# Patient Record
Sex: Female | Born: 1960 | Race: White | Hispanic: No | Marital: Single | Smoking: Never smoker
Health system: Southern US, Community
[De-identification: ages and names within clinical notes are randomized; demographics above are authoritative.]

## PROBLEM LIST (undated history)

## (undated) DIAGNOSIS — I10 Essential (primary) hypertension: Secondary | ICD-10-CM

## (undated) DIAGNOSIS — E785 Hyperlipidemia, unspecified: Secondary | ICD-10-CM

## (undated) DIAGNOSIS — E119 Type 2 diabetes mellitus without complications: Secondary | ICD-10-CM

## (undated) HISTORY — PX: ABDOMINAL HYSTERECTOMY: SHX81

## (undated) HISTORY — DX: Essential (primary) hypertension: I10

## (undated) HISTORY — DX: Hyperlipidemia, unspecified: E78.5

## (undated) HISTORY — DX: Type 2 diabetes mellitus without complications: E11.9

---

## 2004-10-30 ENCOUNTER — Ambulatory Visit: Payer: Self-pay | Admitting: Family Medicine

## 2005-03-03 ENCOUNTER — Ambulatory Visit: Payer: Self-pay | Admitting: Family Medicine

## 2006-03-11 ENCOUNTER — Ambulatory Visit: Payer: Self-pay | Admitting: Family Medicine

## 2007-03-03 ENCOUNTER — Ambulatory Visit: Payer: Self-pay | Admitting: Family Medicine

## 2008-03-05 ENCOUNTER — Ambulatory Visit: Payer: Self-pay | Admitting: Family Medicine

## 2009-05-21 ENCOUNTER — Ambulatory Visit: Payer: Self-pay | Admitting: Family Medicine

## 2010-07-29 ENCOUNTER — Ambulatory Visit: Payer: Self-pay | Admitting: Unknown Physician Specialty

## 2010-10-27 ENCOUNTER — Ambulatory Visit: Payer: Self-pay | Admitting: Family Medicine

## 2011-11-03 ENCOUNTER — Ambulatory Visit: Payer: Self-pay | Admitting: Family Medicine

## 2012-12-15 ENCOUNTER — Ambulatory Visit: Payer: Self-pay | Admitting: Family Medicine

## 2013-11-13 ENCOUNTER — Ambulatory Visit: Payer: Self-pay | Admitting: Family Medicine

## 2013-12-02 ENCOUNTER — Ambulatory Visit: Payer: Self-pay | Admitting: Family Medicine

## 2014-01-30 ENCOUNTER — Ambulatory Visit: Payer: Self-pay | Admitting: Family Medicine

## 2014-04-02 ENCOUNTER — Ambulatory Visit: Payer: Self-pay | Admitting: Family Medicine

## 2014-04-03 ENCOUNTER — Ambulatory Visit: Payer: Self-pay | Admitting: Family Medicine

## 2014-05-04 ENCOUNTER — Ambulatory Visit: Payer: Self-pay | Admitting: Family Medicine

## 2014-07-10 ENCOUNTER — Ambulatory Visit: Payer: Self-pay | Admitting: Surgery

## 2015-01-23 ENCOUNTER — Other Ambulatory Visit: Payer: Self-pay | Admitting: Family Medicine

## 2015-01-23 DIAGNOSIS — Z1231 Encounter for screening mammogram for malignant neoplasm of breast: Secondary | ICD-10-CM

## 2015-02-01 ENCOUNTER — Ambulatory Visit
Admission: RE | Admit: 2015-02-01 | Discharge: 2015-02-01 | Disposition: A | Discharge status: Home / Self Care | Payer: PRIVATE HEALTH INSURANCE | Source: Ambulatory Visit | Attending: Family Medicine | Admitting: Family Medicine

## 2015-02-01 DIAGNOSIS — Z1231 Encounter for screening mammogram for malignant neoplasm of breast: Secondary | ICD-10-CM | POA: Diagnosis present

## 2016-06-04 ENCOUNTER — Other Ambulatory Visit: Payer: Self-pay | Admitting: Family Medicine

## 2016-06-04 DIAGNOSIS — Z1231 Encounter for screening mammogram for malignant neoplasm of breast: Secondary | ICD-10-CM

## 2016-06-11 ENCOUNTER — Ambulatory Visit
Admission: RE | Admit: 2016-06-11 | Discharge: 2016-06-11 | Disposition: A | Discharge status: Home / Self Care | Payer: PRIVATE HEALTH INSURANCE | Source: Ambulatory Visit | Attending: Family Medicine | Admitting: Family Medicine

## 2016-06-11 DIAGNOSIS — Z1231 Encounter for screening mammogram for malignant neoplasm of breast: Secondary | ICD-10-CM | POA: Insufficient documentation

## 2016-10-12 ENCOUNTER — Ambulatory Visit (INDEPENDENT_AMBULATORY_CARE_PROVIDER_SITE_OTHER): Payer: Managed Care, Other (non HMO) | Admitting: Vascular Surgery

## 2016-10-12 ENCOUNTER — Encounter (INDEPENDENT_AMBULATORY_CARE_PROVIDER_SITE_OTHER): Payer: Self-pay | Admitting: Vascular Surgery

## 2016-10-12 VITALS — BP 106/70 | HR 57 | Resp 16 | Ht 62.0 in | Wt 153.0 lb

## 2016-10-12 DIAGNOSIS — E118 Type 2 diabetes mellitus with unspecified complications: Secondary | ICD-10-CM | POA: Diagnosis not present

## 2016-10-12 DIAGNOSIS — E785 Hyperlipidemia, unspecified: Secondary | ICD-10-CM | POA: Diagnosis not present

## 2016-10-12 DIAGNOSIS — I83813 Varicose veins of bilateral lower extremities with pain: Secondary | ICD-10-CM | POA: Diagnosis not present

## 2016-10-12 DIAGNOSIS — E119 Type 2 diabetes mellitus without complications: Secondary | ICD-10-CM | POA: Insufficient documentation

## 2016-10-12 NOTE — Progress Notes (Signed)
Subjective:    Patient ID: Lauren Dominguez, female    DOB: 11-30-1960, 56 y.o.   MRN: [id number] Chief Complaint  Patient presents with  . New Evaluation    Varicose Veins    Presents as a new patient referred by Dr. Su Grand for "bilateral lower extremity painful varicose veins". Patient complains of progressively worsening pain along her varicose veins. This is progressively been worsening over the last 3-4 years. Patient states her right lower extremity symptoms are worse than her left. She has noticed over the last few years an increase in size in her right lower extremity varicose veins. The patient experiences pain along her lower extremity varicose veins which tends to be worse at night. Patient is active and does stand for long periods of time. She also experiences cramping in her legs at night. She denies any surgery or trauma to her lower extremity. She does not wear compression or engage elevation at this time. She denies any fever nausea vomiting.    Review of Systems  Constitutional: Negative.   HENT: Negative.   Eyes: Negative.   Respiratory: Negative.   Cardiovascular:       Lower extremity painful varicose veins  Gastrointestinal: Negative.   Endocrine: Negative.   Genitourinary: Negative.   Musculoskeletal: Negative.   Skin: Negative.   Allergic/Immunologic: Negative.   Neurological: Negative.   Hematological: Negative.   Psychiatric/Behavioral: Negative.       Objective:   Physical Exam  Constitutional: She is oriented to person, place, and time. She appears well-developed and well-nourished. No distress.  HENT:  Head: Normocephalic and atraumatic.  Eyes: Conjunctivae are normal. Pupils are equal, round, and reactive to light.  Neck: Normal range of motion.  Cardiovascular: Normal rate, regular rhythm, normal heart sounds and intact distal pulses.   Pulses:      Radial pulses are 2+ on the right side, and 2+ on the left side.       Dorsalis pedis  pulses are 2+ on the right side, and 2+ on the left side.       Posterior tibial pulses are 2+ on the right side, and 2+ on the left side.  Pulmonary/Chest: Effort normal.  Musculoskeletal: Normal range of motion. She exhibits no edema.  Neurological: She is alert and oriented to person, place, and time.  Skin: Skin is warm and dry. She is not diaphoretic.  >1cm varicose veins noted on the medial aspect of the patient's in her calf. <1cm located scattered on the left lower extremity  Psychiatric: She has a normal mood and affect. Her behavior is normal. Judgment and thought content normal.  Vitals reviewed.   BP 106/70 (BP Location: Right Arm)   Pulse (!) 57   Resp 16   Ht 5\' 2"  (1.575 m)   Wt 153 lb (69.4 kg)   BMI 27.98 kg/m   Past Medical History:  Diagnosis Date  . Diabetes mellitus without complication ([place])   . Hyperlipidemia   . Hypertension     Social History   Social History  . Marital status: Single    Spouse name: N/A  . Number of children: N/A  . Years of education: N/A   Occupational History  . Not on file.   Social History Main Topics  . Smoking status: Never Smoker  . Smokeless tobacco: Never Used  . Alcohol use Yes  . Drug use: Unknown  . Sexual activity: Not on file   Other Topics Concern  . Not on file  Social History Narrative  . No narrative on file    Past Surgical History:  Procedure Laterality Date  . ABDOMINAL HYSTERECTOMY      Family History  Problem Relation Age of Onset  . Breast cancer Maternal Grandmother 60    No Known Allergies     Assessment & Plan:  Presents as a new patient referred by Dr. Su Grand for "bilateral lower extremity painful varicose veins". Patient complains of progressively worsening pain along her varicose veins. This is progressively been worsening over the last 3-4 years. Patient states her right lower extremity symptoms are worse than her left. She has noticed over the last few years an increase in  size in her right lower extremity varicose veins. The patient experiences pain along her lower extremity varicose veins which tends to be worse at night. Patient is active and does stand for long periods of time. She also experiences cramping in her legs at night. She denies any surgery or trauma to her lower extremity. She does not wear compression or engage elevation at this time. She denies any fever nausea vomiting.  1. Varicose veins of bilateral lower extremities with pain - New The patient was encouraged to wear graduated compression stockings (20-30 mmHg) on a daily basis. The patient was instructed to begin wearing the stockings first thing in the morning and removing them in the evening. The patient was instructed specifically not to sleep in the stockings. Prescription given.  In addition, behavioral modification including elevation during the day will be initiated. Anti-inflammatories for pain. We'll bring patient back in 3 months to undergo a bilateral lower extremity venous duplex to rule out reflux.  - VAS [place] LOWER EXTREMITY VENOUS REFLUX; Future  2. Type 2 diabetes mellitus with complication, unspecified whether long term insulin use (HCC) - stable Encouraged good control as its slows the progression of atherosclerotic disease  3. Hyperlipidemia, unspecified hyperlipidemia type - stable Encouraged good control as its slows the progression of atherosclerotic disease   No current outpatient prescriptions on file prior to visit.   No current facility-administered medications on file prior to visit.     There are no Patient Instructions on file for this visit. No Follow-up on file.   Jaidynn Balster A Jax Kentner, PA-C

## 2017-01-14 ENCOUNTER — Encounter (INDEPENDENT_AMBULATORY_CARE_PROVIDER_SITE_OTHER): Payer: Self-pay | Admitting: Ophthalmology

## 2017-01-14 ENCOUNTER — Encounter (INDEPENDENT_AMBULATORY_CARE_PROVIDER_SITE_OTHER): Payer: PRIVATE HEALTH INSURANCE | Admitting: Ophthalmology

## 2017-01-14 DIAGNOSIS — H43813 Vitreous degeneration, bilateral: Secondary | ICD-10-CM

## 2017-01-14 DIAGNOSIS — H2513 Age-related nuclear cataract, bilateral: Secondary | ICD-10-CM | POA: Diagnosis not present

## 2017-01-14 DIAGNOSIS — H35033 Hypertensive retinopathy, bilateral: Secondary | ICD-10-CM | POA: Diagnosis not present

## 2017-01-14 DIAGNOSIS — H338 Other retinal detachments: Secondary | ICD-10-CM

## 2017-01-14 DIAGNOSIS — I10 Essential (primary) hypertension: Secondary | ICD-10-CM

## 2017-01-18 ENCOUNTER — Ambulatory Visit (INDEPENDENT_AMBULATORY_CARE_PROVIDER_SITE_OTHER): Payer: PRIVATE HEALTH INSURANCE | Admitting: Vascular Surgery

## 2017-01-18 ENCOUNTER — Encounter (INDEPENDENT_AMBULATORY_CARE_PROVIDER_SITE_OTHER): Payer: Managed Care, Other (non HMO)

## 2017-06-17 ENCOUNTER — Other Ambulatory Visit: Payer: Self-pay | Admitting: Family Medicine

## 2017-06-17 DIAGNOSIS — Z1231 Encounter for screening mammogram for malignant neoplasm of breast: Secondary | ICD-10-CM

## 2017-06-30 ENCOUNTER — Ambulatory Visit
Admission: RE | Admit: 2017-06-30 | Discharge: 2017-06-30 | Disposition: A | Discharge status: Home / Self Care | Payer: PRIVATE HEALTH INSURANCE | Source: Ambulatory Visit | Attending: Family Medicine | Admitting: Family Medicine

## 2017-06-30 DIAGNOSIS — Z1231 Encounter for screening mammogram for malignant neoplasm of breast: Secondary | ICD-10-CM

## 2018-05-04 DIAGNOSIS — C801 Malignant (primary) neoplasm, unspecified: Secondary | ICD-10-CM

## 2018-05-04 HISTORY — DX: Malignant (primary) neoplasm, unspecified: C80.1

## 2018-08-15 ENCOUNTER — Other Ambulatory Visit: Payer: Self-pay | Admitting: Family Medicine

## 2018-08-15 DIAGNOSIS — Z1231 Encounter for screening mammogram for malignant neoplasm of breast: Secondary | ICD-10-CM

## 2018-10-31 ENCOUNTER — Other Ambulatory Visit: Payer: Self-pay

## 2018-10-31 ENCOUNTER — Ambulatory Visit
Admission: RE | Admit: 2018-10-31 | Discharge: 2018-10-31 | Disposition: A | Discharge status: Home / Self Care | Payer: PRIVATE HEALTH INSURANCE | Source: Ambulatory Visit | Attending: Family Medicine | Admitting: Family Medicine

## 2018-10-31 DIAGNOSIS — Z1231 Encounter for screening mammogram for malignant neoplasm of breast: Secondary | ICD-10-CM | POA: Diagnosis not present

## 2019-10-03 ENCOUNTER — Other Ambulatory Visit: Payer: Self-pay | Admitting: Family Medicine

## 2019-10-03 DIAGNOSIS — Z1231 Encounter for screening mammogram for malignant neoplasm of breast: Secondary | ICD-10-CM

## 2019-11-01 ENCOUNTER — Ambulatory Visit
Admission: RE | Admit: 2019-11-01 | Discharge: 2019-11-01 | Disposition: A | Discharge status: Home / Self Care | Payer: PRIVATE HEALTH INSURANCE | Source: Ambulatory Visit | Attending: Family Medicine | Admitting: Family Medicine

## 2019-11-01 DIAGNOSIS — Z1231 Encounter for screening mammogram for malignant neoplasm of breast: Secondary | ICD-10-CM | POA: Diagnosis not present

## 2020-10-28 ENCOUNTER — Other Ambulatory Visit: Payer: Self-pay | Admitting: Family Medicine

## 2020-10-28 DIAGNOSIS — Z1231 Encounter for screening mammogram for malignant neoplasm of breast: Secondary | ICD-10-CM

## 2020-11-05 ENCOUNTER — Other Ambulatory Visit: Payer: Self-pay

## 2020-11-05 ENCOUNTER — Ambulatory Visit
Admission: RE | Admit: 2020-11-05 | Discharge: 2020-11-05 | Disposition: A | Discharge status: Home / Self Care | Payer: BC Managed Care – PPO | Source: Ambulatory Visit | Attending: Family Medicine | Admitting: Family Medicine

## 2020-11-05 DIAGNOSIS — Z1231 Encounter for screening mammogram for malignant neoplasm of breast: Secondary | ICD-10-CM | POA: Insufficient documentation

## 2021-10-21 ENCOUNTER — Other Ambulatory Visit: Payer: Self-pay | Admitting: Family Medicine

## 2021-10-21 DIAGNOSIS — Z1231 Encounter for screening mammogram for malignant neoplasm of breast: Secondary | ICD-10-CM

## 2021-11-13 ENCOUNTER — Ambulatory Visit
Admission: RE | Admit: 2021-11-13 | Discharge: 2021-11-13 | Disposition: A | Discharge status: Home / Self Care | Payer: BC Managed Care – PPO | Source: Ambulatory Visit | Attending: Family Medicine | Admitting: Family Medicine

## 2021-11-13 DIAGNOSIS — Z1231 Encounter for screening mammogram for malignant neoplasm of breast: Secondary | ICD-10-CM | POA: Insufficient documentation

## 2022-11-16 ENCOUNTER — Other Ambulatory Visit: Payer: Self-pay | Admitting: Family Medicine

## 2022-11-16 DIAGNOSIS — Z1231 Encounter for screening mammogram for malignant neoplasm of breast: Secondary | ICD-10-CM

## 2022-12-02 ENCOUNTER — Ambulatory Visit
Admission: RE | Admit: 2022-12-02 | Discharge: 2022-12-02 | Disposition: A | Discharge status: Home / Self Care | Payer: BC Managed Care – PPO | Source: Ambulatory Visit | Attending: Family Medicine | Admitting: Family Medicine

## 2022-12-02 DIAGNOSIS — Z1231 Encounter for screening mammogram for malignant neoplasm of breast: Secondary | ICD-10-CM | POA: Diagnosis present

## 2022-12-04 ENCOUNTER — Encounter: Payer: Self-pay | Admitting: Family Medicine

## 2022-12-07 ENCOUNTER — Other Ambulatory Visit: Payer: Self-pay | Admitting: Family Medicine

## 2022-12-07 DIAGNOSIS — R928 Other abnormal and inconclusive findings on diagnostic imaging of breast: Secondary | ICD-10-CM

## 2022-12-09 ENCOUNTER — Ambulatory Visit
Admission: RE | Admit: 2022-12-09 | Discharge: 2022-12-09 | Disposition: A | Discharge status: Home / Self Care | Payer: BC Managed Care – PPO | Source: Ambulatory Visit | Attending: Family Medicine | Admitting: Family Medicine

## 2022-12-09 DIAGNOSIS — R928 Other abnormal and inconclusive findings on diagnostic imaging of breast: Secondary | ICD-10-CM | POA: Insufficient documentation

## 2023-01-18 ENCOUNTER — Other Ambulatory Visit: Payer: Self-pay | Admitting: Internal Medicine

## 2023-01-18 DIAGNOSIS — M5489 Other dorsalgia: Secondary | ICD-10-CM

## 2023-01-18 DIAGNOSIS — R1013 Epigastric pain: Secondary | ICD-10-CM

## 2023-01-20 ENCOUNTER — Ambulatory Visit
Admission: RE | Admit: 2023-01-20 | Discharge: 2023-01-20 | Disposition: A | Discharge status: Home / Self Care | Payer: BC Managed Care – PPO | Source: Ambulatory Visit | Attending: Internal Medicine | Admitting: Internal Medicine

## 2023-01-20 DIAGNOSIS — R1013 Epigastric pain: Secondary | ICD-10-CM | POA: Insufficient documentation

## 2023-01-20 DIAGNOSIS — M5489 Other dorsalgia: Secondary | ICD-10-CM | POA: Diagnosis present

## 2023-01-20 MED ORDER — IOHEXOL 300 MG/ML  SOLN
100.0000 mL | Freq: Once | INTRAMUSCULAR | Status: AC | PRN
  Administered 2023-01-20: 100 mL via INTRAVENOUS

## 2023-02-15 IMAGING — MG MM DIGITAL SCREENING BILAT W/ TOMO AND CAD
8 series · 8 of 24 positions shown · non-contrast
Comparison: Previous exam(s).

CLINICAL DATA: Screening.

EXAM:
DIGITAL SCREENING BILATERAL MAMMOGRAM WITH TOMOSYNTHESIS AND CAD
TECHNIQUE: Bilateral screening digital craniocaudal and mediolateral oblique
mammograms were obtained. Bilateral screening digital breast
tomosynthesis was performed. The images were evaluated with
computer-aided detection.

[L CC synth-2D]
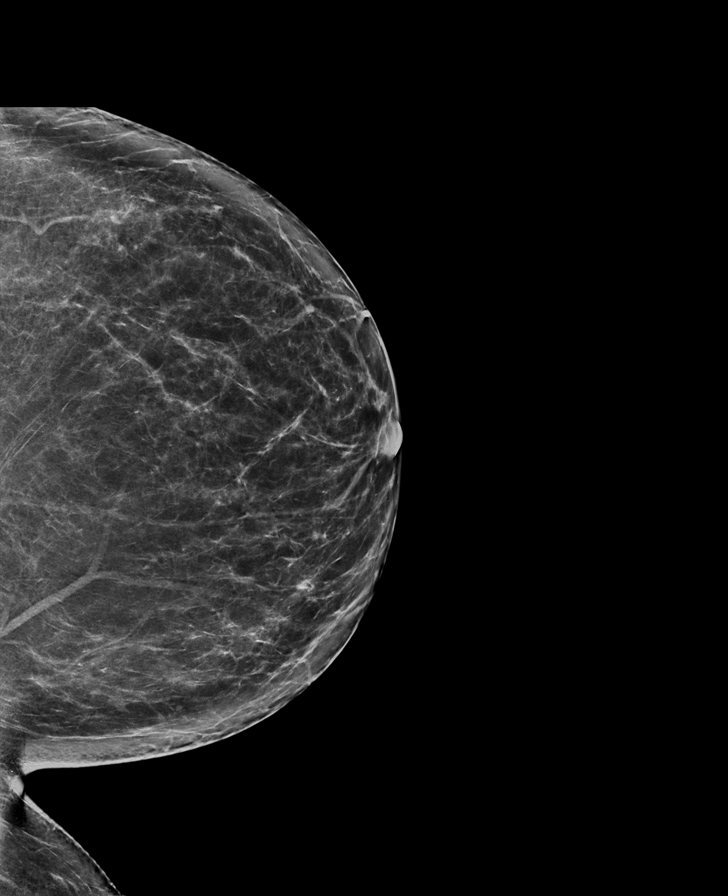

[R CC synth-2D]
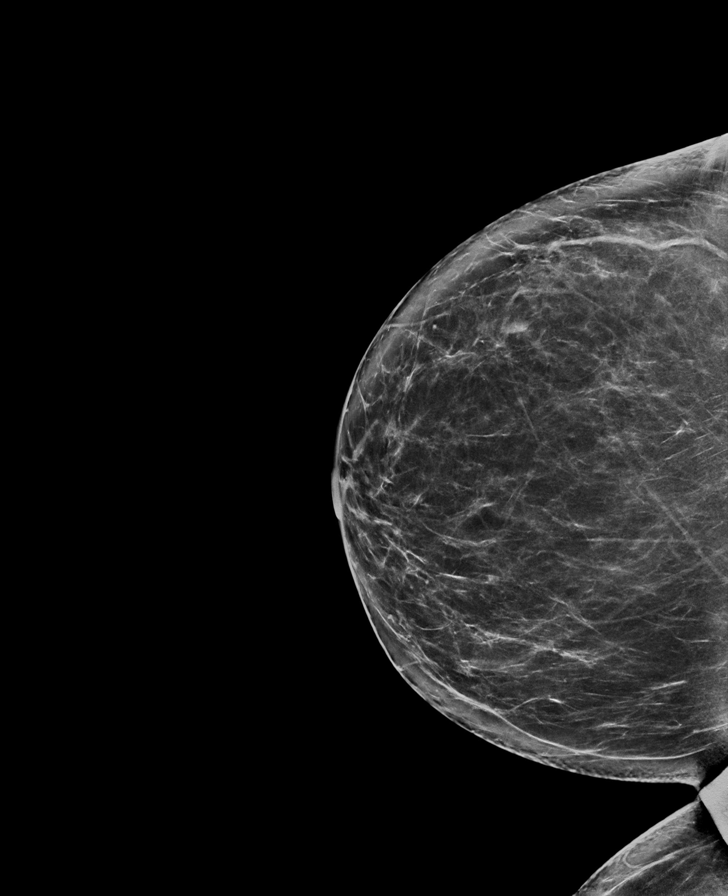

[R MLO synth-2D]
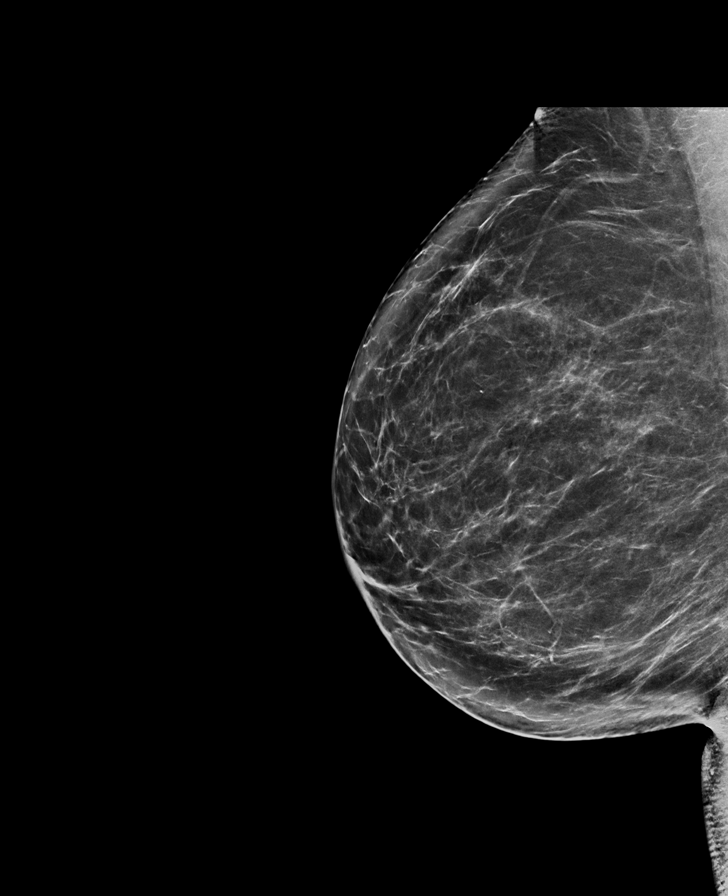

[L MLO synth-2D]
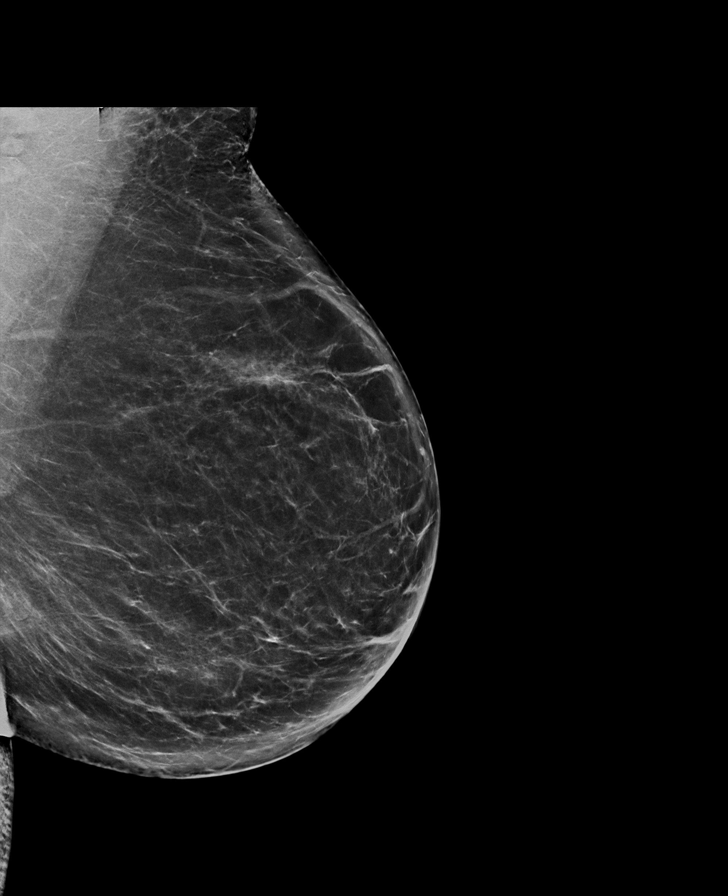

[R CC tomo · tomo slice 41/80.0]
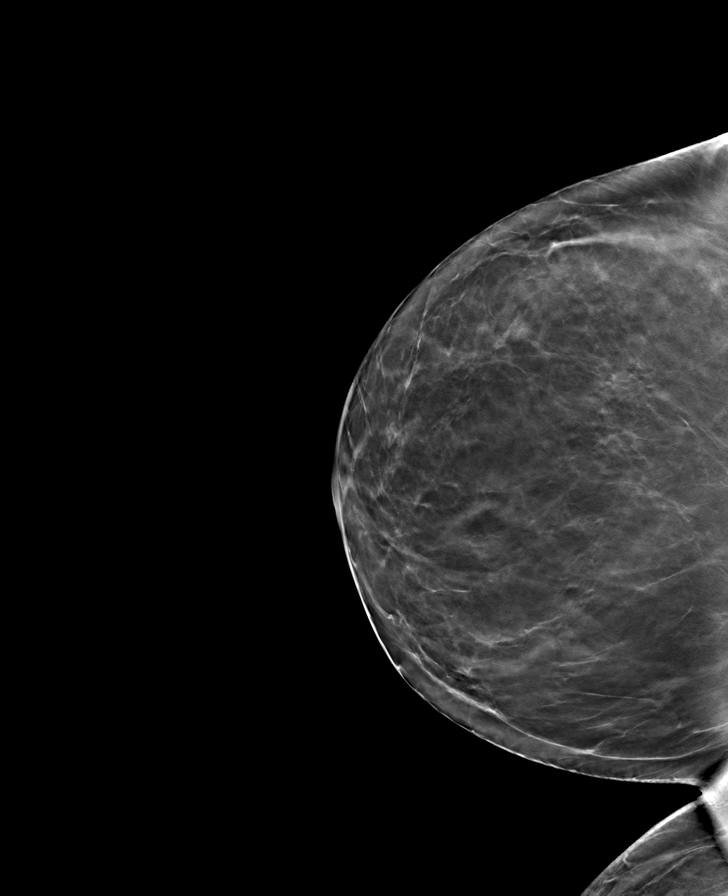

[R MLO tomo · tomo slice 41/81.0]
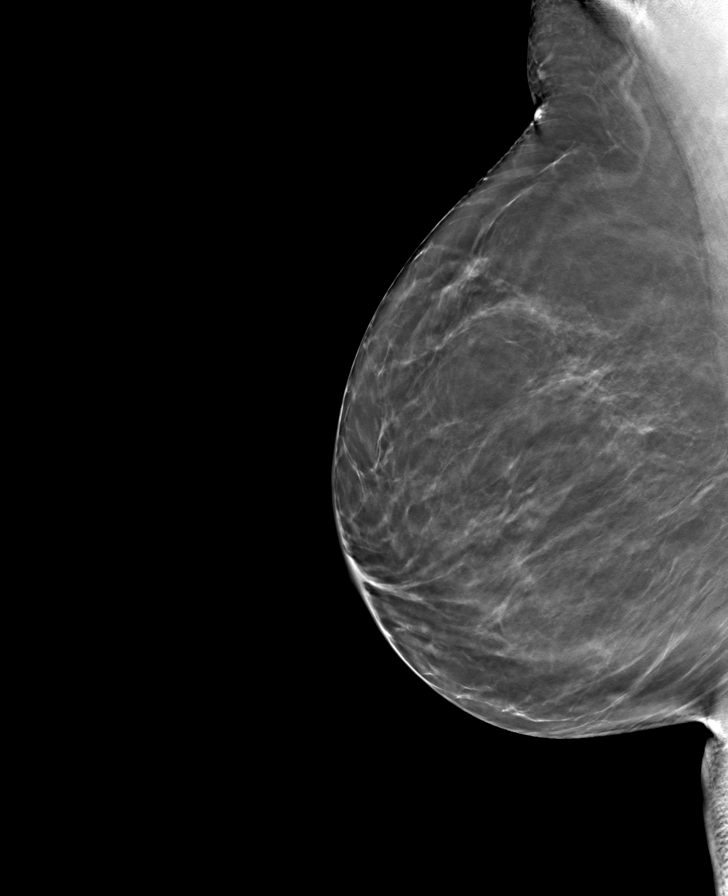

[L MLO tomo · tomo slice 45/88.0]
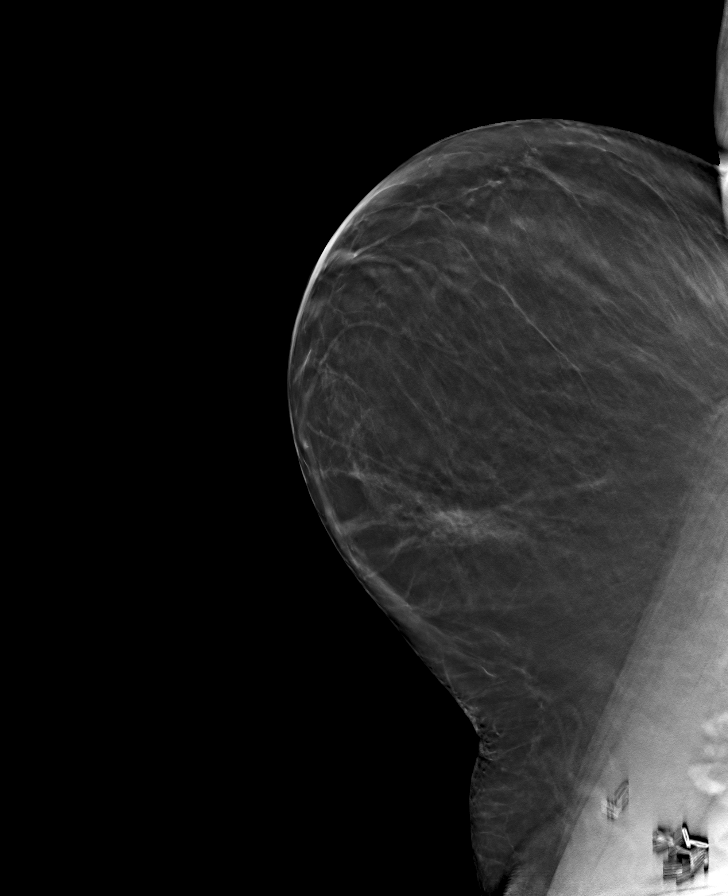

[L CC tomo · tomo slice 40/79.0]
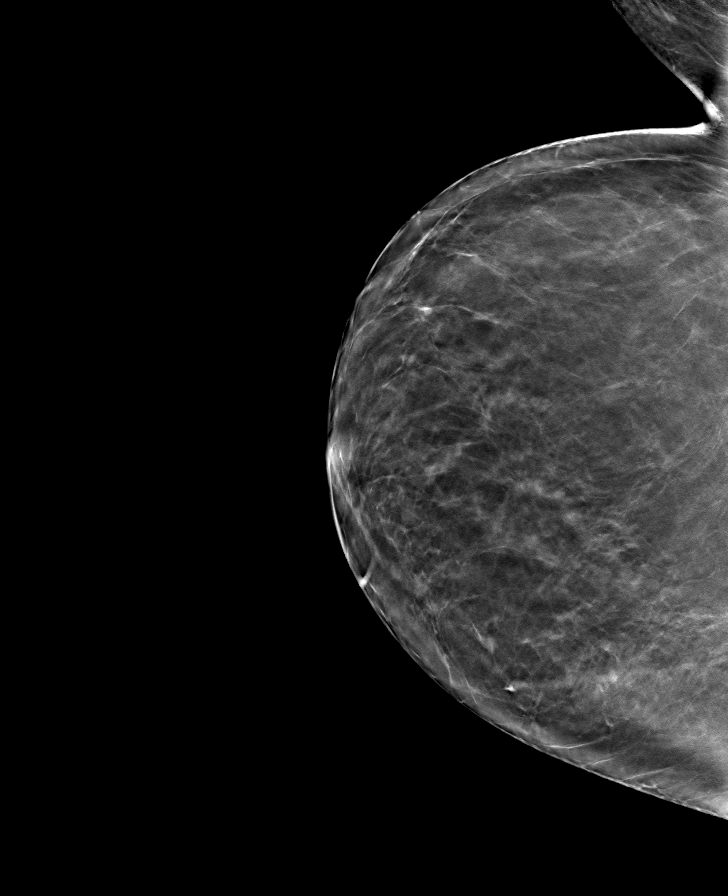

[8 of 24 positions shown; findings below may reference images not displayed]

ACR Breast Density Category b: There are scattered areas of
fibroglandular density.
FINDINGS: There are no findings suspicious for malignancy.
IMPRESSION: No mammographic evidence of malignancy. A result letter of this
screening mammogram will be mailed directly to the patient.

RECOMMENDATION:
Screening mammogram in one year. (Code:51-O-LD2)

BI-RADS CATEGORY  1: Negative.

## 2024-02-03 ENCOUNTER — Other Ambulatory Visit: Payer: Self-pay | Admitting: Family Medicine

## 2024-02-03 DIAGNOSIS — Z1231 Encounter for screening mammogram for malignant neoplasm of breast: Secondary | ICD-10-CM

## 2024-02-24 ENCOUNTER — Ambulatory Visit
Admission: RE | Admit: 2024-02-24 | Discharge: 2024-02-24 | Disposition: A | Discharge status: Home / Self Care | Source: Ambulatory Visit | Attending: Family Medicine | Admitting: Family Medicine

## 2024-02-24 DIAGNOSIS — Z1231 Encounter for screening mammogram for malignant neoplasm of breast: Secondary | ICD-10-CM | POA: Insufficient documentation
